# Patient Record
Sex: Male | Born: 1995 | Race: White | Hispanic: No | Marital: Single | Smoking: Current every day smoker
Health system: Southern US, Community
[De-identification: ages and names within clinical notes are randomized; demographics above are authoritative.]

## PROBLEM LIST (undated history)

## (undated) HISTORY — PX: WRIST SURGERY: SHX841

---
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
## 2014-08-22 IMAGING — CR DG FINGER LITTLE 2+V*L*
4 series · 4 of 4 positions shown · non-contrast
Comparison: None

CLINICAL DATA: Pain left little finger

EXAM:
LEFT LITTLE FINGER 2+V

[view not recorded (1 of 4)]
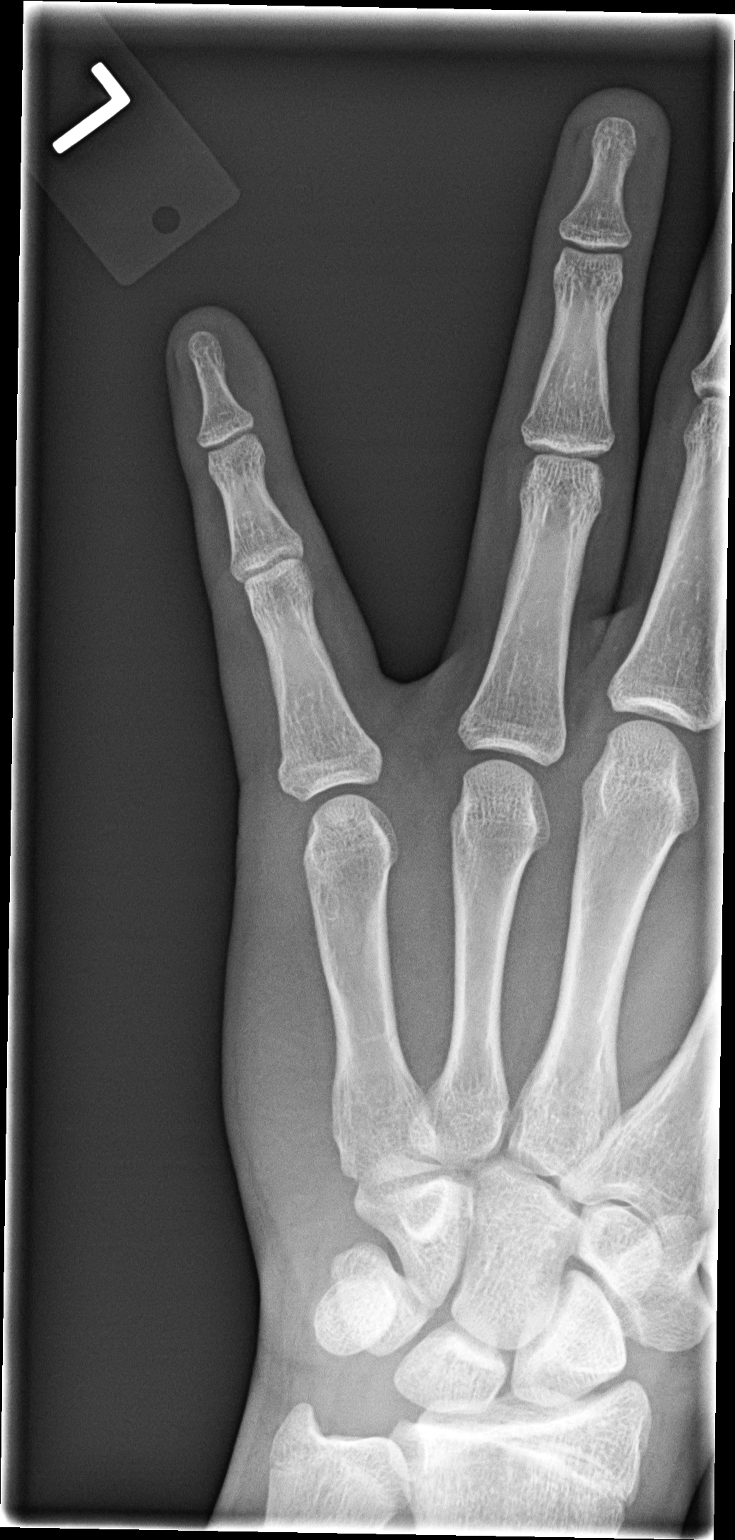

[view not recorded (2 of 4)]
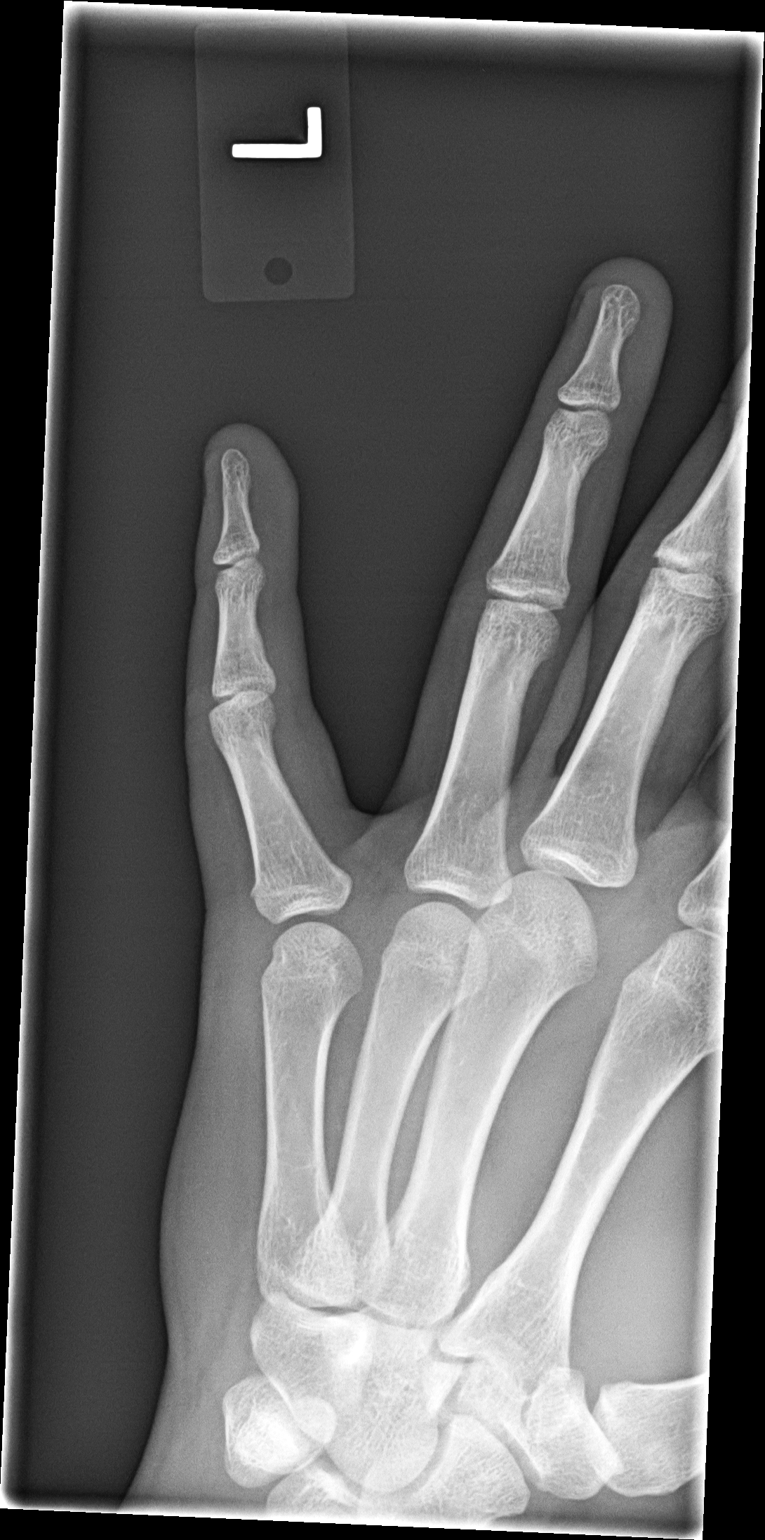

[view not recorded (3 of 4)]
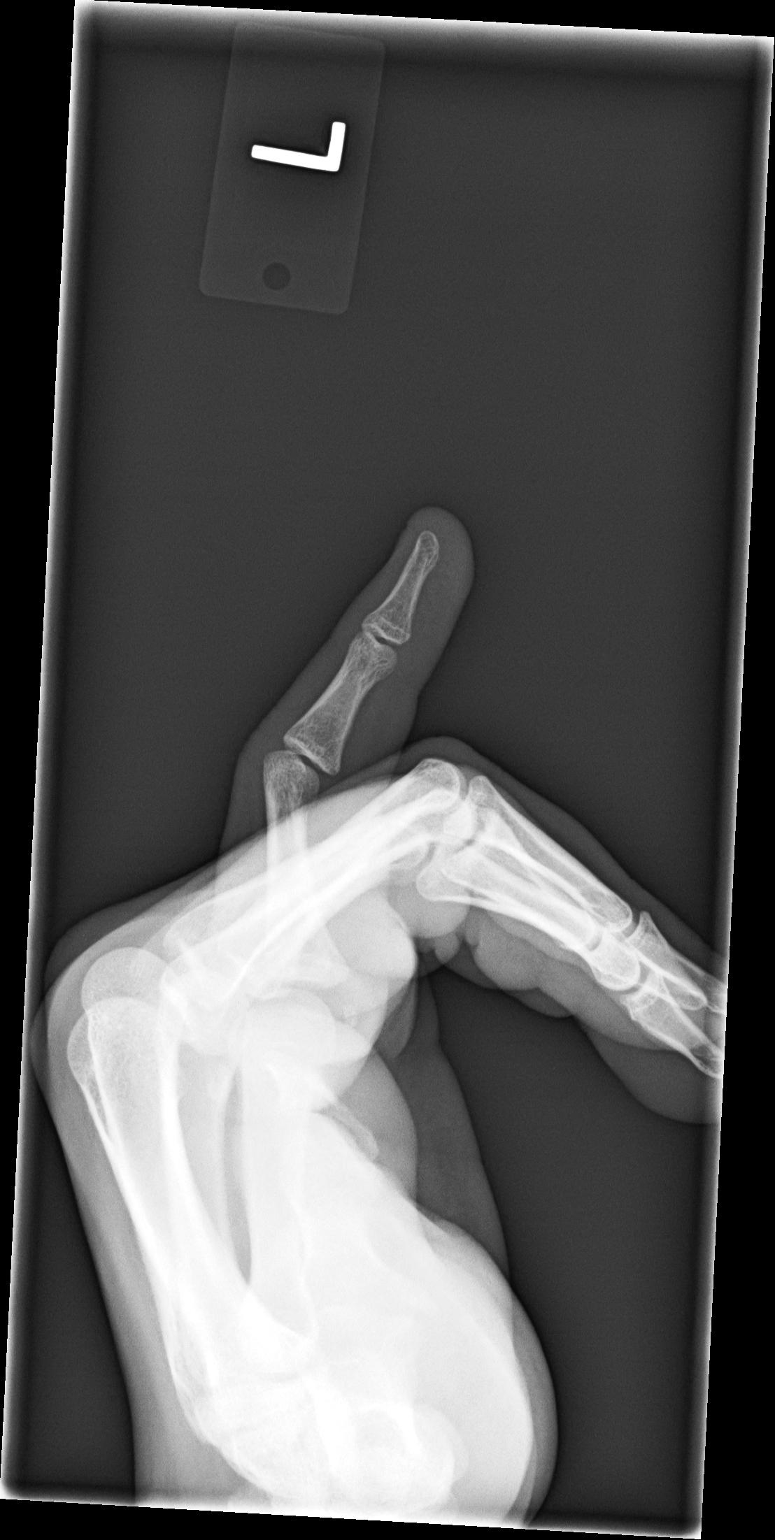

[view not recorded (4 of 4)]
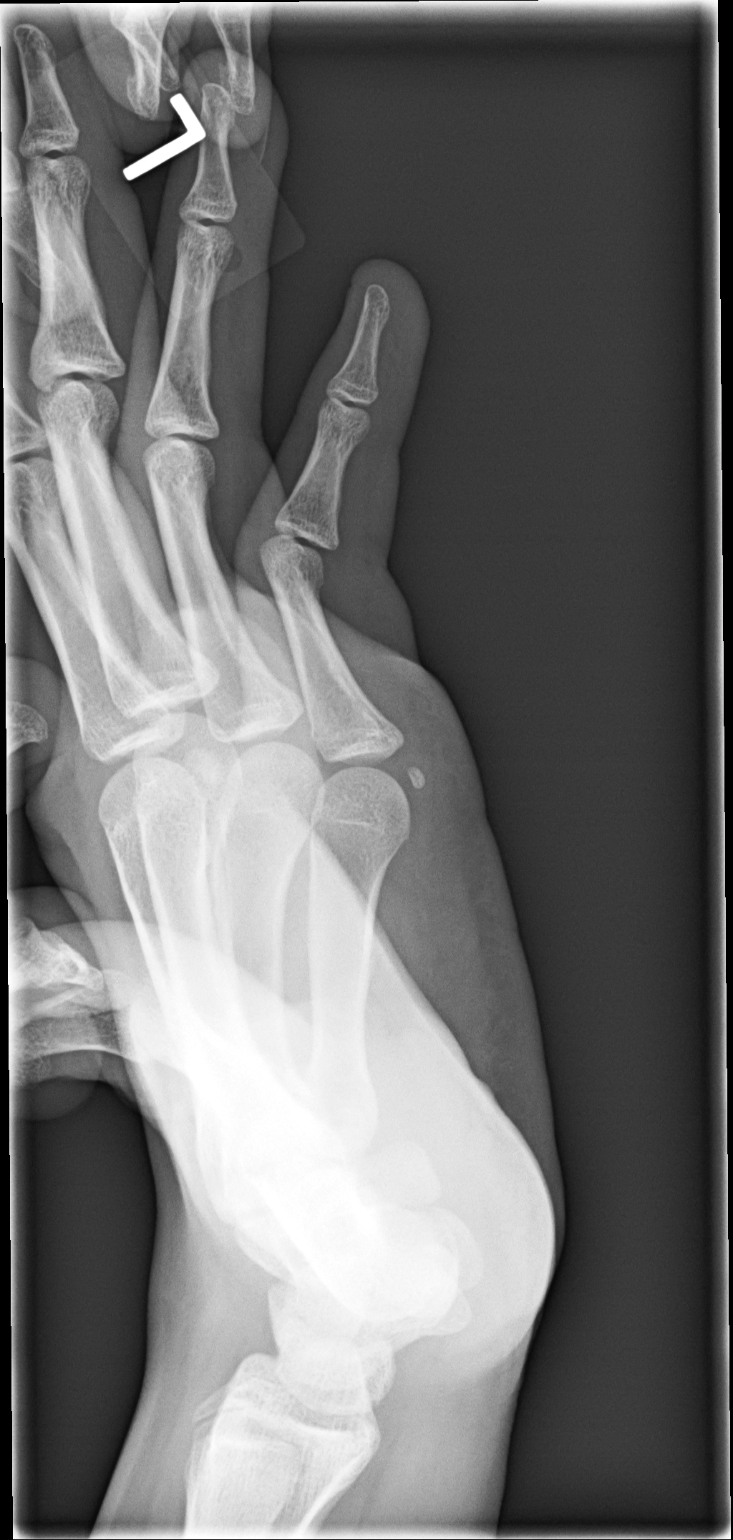

[4 of 4 positions shown; findings below may reference images not displayed]

FINDINGS: Osseous mineralization normal.

Joint spaces preserved.

Transverse fracture identified at base of proximal phalanx left
little finger, minimally displaced.

No additional fracture, dislocation or bone destruction.
IMPRESSION: Transverse fracture at base of proximal phalanx left little finger.

## 2014-11-29 ENCOUNTER — Encounter: Payer: Self-pay | Admitting: Family Medicine

## 2014-11-29 ENCOUNTER — Ambulatory Visit (INDEPENDENT_AMBULATORY_CARE_PROVIDER_SITE_OTHER): Payer: Commercial Managed Care - PPO | Admitting: Family Medicine

## 2014-11-29 VITALS — BP 132/70 | HR 65 | Temp 97.5°F | Ht 70.5 in | Wt 248.0 lb

## 2014-11-29 DIAGNOSIS — B360 Pityriasis versicolor: Secondary | ICD-10-CM

## 2014-11-29 DIAGNOSIS — M7580 Other shoulder lesions, unspecified shoulder: Secondary | ICD-10-CM

## 2014-11-29 DIAGNOSIS — M7581 Other shoulder lesions, right shoulder: Secondary | ICD-10-CM | POA: Diagnosis not present

## 2014-11-29 DIAGNOSIS — M778 Other enthesopathies, not elsewhere classified: Secondary | ICD-10-CM

## 2014-11-29 MED ORDER — DICLOFENAC SODIUM 75 MG PO TBEC: 75.0000 mg | DELAYED_RELEASE_TABLET | Freq: Two times a day (BID) | ORAL | Status: AC

## 2014-11-29 MED ORDER — FLUCONAZOLE 150 MG PO TABS: 150.0000 mg | ORAL_TABLET | Freq: Every day | ORAL | Status: DC

## 2014-11-29 NOTE — Progress Notes (Signed)
Subjective:  Patient ID: Lawrence Gallagher, male    DOB: [birth year]  Age: 19 y.o. MRN: [id number]  CC: patchy skin irritation and Shoulder Pain   HPI Lawrence ChurchHayden Gallagher presents for Right anterior shoulder pain. He patient states that the pain in the shoulders has been there for a few months but just got bad a few days ago. Specifically throwing football yesterday made it much worse. Additionally he states that lifting weights when he uses the bench press he has more pain in the right anterior shoulder. Also when he does his arm curl.  The rash he's noted for several months does tend to affect his ability is tan making it less consistent over his upper back and his left upper arm and shoulder. This has been present since last summer but he's noticed it again early this spring. Skin feels a little dry but otherwise is fine.  History Lawrence BasemanHayden has no past medical history on file.   He has past surgical history that includes Wrist surgery (Right, age 19).   His family history is not on file.He reports that he has never smoked. He does not have any smokeless tobacco history on file. He reports that he does not drink alcohol or use illicit drugs.  No current outpatient prescriptions on file prior to visit.   No current facility-administered medications on file prior to visit.    ROS Review of Systems  Constitutional: Negative for fever, chills, diaphoresis, activity change, appetite change and unexpected weight change.  HENT: Negative for congestion, ear discharge, ear pain, hearing loss, nosebleeds, postnasal drip, rhinorrhea, sinus pressure, sneezing, sore throat and trouble swallowing.   Respiratory: Negative for cough, chest tightness, shortness of breath and wheezing.   Cardiovascular: Negative for chest pain and palpitations.  Gastrointestinal: Negative for nausea, vomiting, abdominal pain, diarrhea, constipation and abdominal distention.  Endocrine: Negative for cold intolerance and heat  intolerance.  Genitourinary: Negative for dysuria, hematuria and flank pain.  Musculoskeletal: Positive for myalgias and arthralgias. Negative for joint swelling.  Skin: Positive for rash.  Neurological: Negative for dizziness and headaches.  Psychiatric/Behavioral: Negative for dysphoric mood, decreased concentration and agitation. The patient is not nervous/anxious.     Objective:  BP 132/70 mmHg  Pulse 65  Temp(Src) 97.5 F (36.4 C) (Oral)  Ht 5' 10.5" (1.791 m)  Wt 248 lb (112.492 kg)  BMI 35.07 kg/m2  BP Readings from Last 3 Encounters:  11/29/14 132/70  12/08/13 142/72  06/09/13 127/68    Wt Readings from Last 3 Encounters:  11/29/14 248 lb (112.492 kg) (99 %*, Z = 2.39)  12/08/13 215 lb 9.6 oz (97.796 kg) (97 %*, Z = 1.88)  06/09/13 220 lb (99.791 kg) (98 %*, Z = 2.03)   * Growth percentiles are based on CDC 2-20 Years data.     Physical Exam  Constitutional: He is oriented to person, place, and time. He appears well-developed and well-nourished. No distress.  HENT:  Head: Normocephalic and atraumatic.  Right Ear: External ear normal.  Left Ear: External ear normal.  Nose: Nose normal.  Mouth/Throat: Oropharynx is clear and moist.  Eyes: Conjunctivae and EOM are normal. Pupils are equal, round, and reactive to light.  Neck: Normal range of motion. Neck supple. No thyromegaly present.  Cardiovascular: Normal rate, regular rhythm and normal heart sounds.   No murmur heard. Pulmonary/Chest: Effort normal and breath sounds normal. No respiratory distress. He has no wheezes. He has no rales.  Abdominal: Soft. Bowel sounds are normal. He exhibits  no distension. There is no tenderness.  Lymphadenopathy:    He has no cervical adenopathy.  Neurological: He is alert and oriented to person, place, and time. He has normal reflexes.  Skin: Skin is warm and dry.  Psychiatric: He has a normal mood and affect. His behavior is normal. Judgment and thought content normal.     No results found for: HGBA1C  No results found for: WBC, HGB, HCT, PLT, GLUCOSE, CHOL, TRIG, HDL, LDLDIRECT, LDLCALC, ALT, AST, NA, K, CL, CREATININE, BUN, CO2, TSH, PSA, INR, GLUF, HGBA1C, MICROALBUR  No results found.  Assessment & Plan:   Lawrence Gallagher was seen today for patchy skin irritation and shoulder pain.  Diagnoses and all orders for this visit:  Tendonitis of shoulder, right  Tinea versicolor  Other orders -     fluconazole (DIFLUCAN) 150 MG tablet; Take 1 tablet (150 mg total) by mouth daily. -     diclofenac (VOLTAREN) 75 MG EC tablet; Take 1 tablet (75 mg total) by mouth 2 (two) times daily.   I have discontinued Mr. Stanwood naproxen. I am also having him start on fluconazole and diclofenac.  Meds ordered this encounter  Medications  . fluconazole (DIFLUCAN) 150 MG tablet    Sig: Take 1 tablet (150 mg total) by mouth daily.    Dispense:  5 tablet    Refill:  0  . diclofenac (VOLTAREN) 75 MG EC tablet    Sig: Take 1 tablet (75 mg total) by mouth 2 (two) times daily.    Dispense:  60 tablet    Refill:  2     Follow-up: No Follow-up on file.  Mechele Claude, M.D.

## 2015-10-26 ENCOUNTER — Telehealth: Payer: Self-pay | Admitting: Family Medicine

## 2015-10-26 MED ORDER — FLUCONAZOLE 150 MG PO TABS: 150.0000 mg | ORAL_TABLET | Freq: Every day | ORAL | Status: DC

## 2015-10-26 NOTE — Telephone Encounter (Signed)
Pt's mother is aware rx was sent to pharmacy.

## 2015-10-26 NOTE — Telephone Encounter (Signed)
done

## 2016-11-08 ENCOUNTER — Telehealth: Payer: Self-pay | Admitting: Family Medicine

## 2016-11-08 ENCOUNTER — Other Ambulatory Visit: Payer: Self-pay

## 2016-11-08 MED ORDER — FLUCONAZOLE 150 MG PO TABS: 150.0000 mg | ORAL_TABLET | Freq: Every day | ORAL | 1 refills | Status: AC

## 2016-11-08 NOTE — Telephone Encounter (Signed)
What is the name of the medication? It is a cream for a rash that he gets on his shoulders. Mom thinks it is the only rx he has gotten here.   Have you contacted your pharmacy to request a refill? no  Which pharmacy would you [facility] sent to?[facility] [address].   Patient notified that their request is being sent to the clinical staff for review and that they should receive a call once it is complete. If they do not receive a call within 24 hours they can check with their pharmacy or our office.

## 2016-11-08 NOTE — Telephone Encounter (Signed)
Spoke with mother and explained to her we had not prescribed a cream for Lawrence Gallagher, he had been prescribed Diflucan 150 mg.  She called pharmacy to check and called back to let me know this is correct.  Medication called in to [facility] ([facility] [address]).

## 2017-01-03 ENCOUNTER — Emergency Department (HOSPITAL_COMMUNITY)
Admission: EM | Admit: 2017-01-03 | Discharge: 2017-01-03 | Disposition: A | Discharge status: Home / Self Care | Payer: Self-pay | Attending: Emergency Medicine | Admitting: Emergency Medicine

## 2017-01-03 ENCOUNTER — Encounter (HOSPITAL_COMMUNITY): Payer: Self-pay | Admitting: Emergency Medicine

## 2017-01-03 ENCOUNTER — Emergency Department (HOSPITAL_COMMUNITY): Payer: Self-pay

## 2017-01-03 DIAGNOSIS — R1011 Right upper quadrant pain: Secondary | ICD-10-CM | POA: Insufficient documentation

## 2017-01-03 DIAGNOSIS — F17200 Nicotine dependence, unspecified, uncomplicated: Secondary | ICD-10-CM | POA: Insufficient documentation

## 2017-01-03 DIAGNOSIS — Z79899 Other long term (current) drug therapy: Secondary | ICD-10-CM | POA: Insufficient documentation

## 2017-01-03 LAB — CBC
HCT: 46.8 % (ref 39.0–52.0)
HEMOGLOBIN: 16.6 g/dL (ref 13.0–17.0)
MCH: 30.9 pg (ref 26.0–34.0)
MCHC: 35.5 g/dL (ref 30.0–36.0)
MCV: 87.2 fL (ref 78.0–100.0)
Platelets: 225 10*3/uL (ref 150–400)
RBC: 5.37 MIL/uL (ref 4.22–5.81)
RDW: 12.5 % (ref 11.5–15.5)
WBC: 10.9 10*3/uL — ABNORMAL HIGH (ref 4.0–10.5)

## 2017-01-03 LAB — COMPREHENSIVE METABOLIC PANEL
ALBUMIN: 4.7 g/dL (ref 3.5–5.0)
ALK PHOS: 68 U/L (ref 38–126)
ALT: 16 U/L — AB (ref 17–63)
ANION GAP: 8 (ref 5–15)
AST: 17 U/L (ref 15–41)
BUN: 8 mg/dL (ref 6–20)
CALCIUM: 9.6 mg/dL (ref 8.9–10.3)
CO2: 27 mmol/L (ref 22–32)
CREATININE: 1 mg/dL (ref 0.61–1.24)
Chloride: 104 mmol/L (ref 101–111)
GFR calc Af Amer: >60 mL/min (ref >60)
GFR calc non Af Amer: >60 mL/min (ref >60)
GLUCOSE: 92 mg/dL (ref 65–99)
Potassium: 3.7 mmol/L (ref 3.5–5.1)
SODIUM: 139 mmol/L (ref 135–145)
Total Bilirubin: 1.7 mg/dL — ABNORMAL HIGH (ref 0.3–1.2)
Total Protein: 7.5 g/dL (ref 6.5–8.1)

## 2017-01-03 LAB — URINALYSIS, ROUTINE W REFLEX MICROSCOPIC
BILIRUBIN URINE: NEGATIVE
Glucose, UA: NEGATIVE mg/dL
HGB URINE DIPSTICK: NEGATIVE
Ketones, ur: 5 mg/dL — AB
Leukocytes, UA: NEGATIVE
Nitrite: NEGATIVE
Protein, ur: NEGATIVE mg/dL
SPECIFIC GRAVITY, URINE: 1.015 (ref 1.005–1.030)
pH: 7 (ref 5.0–8.0)

## 2017-01-03 LAB — LIPASE, BLOOD: Lipase: 18 U/L (ref 11–51)

## 2017-01-03 MED ORDER — ONDANSETRON 8 MG PO TBDP: 8.0000 mg | ORAL_TABLET | Freq: Three times a day (TID) | ORAL | 0 refills | Status: AC | PRN

## 2017-01-03 MED ORDER — FAMOTIDINE 20 MG PO TABS: 20.0000 mg | ORAL_TABLET | Freq: Two times a day (BID) | ORAL | 0 refills | Status: AC

## 2017-01-03 MED ORDER — ONDANSETRON HCL 4 MG/2ML IJ SOLN
4.0000 mg | Freq: Once | INTRAMUSCULAR | Status: AC
  Administered 2017-01-03: 4 mg via INTRAVENOUS
  Filled 2017-01-03: qty 2

## 2017-01-03 MED ORDER — SODIUM CHLORIDE 0.9 % IV BOLUS (SEPSIS)
1000.0000 mL | Freq: Once | INTRAVENOUS | Status: AC
  Administered 2017-01-03: 1000 mL via INTRAVENOUS

## 2017-01-03 MED ORDER — NAPROXEN 500 MG PO TABS: 500.0000 mg | ORAL_TABLET | Freq: Two times a day (BID) | ORAL | 0 refills | Status: AC

## 2017-01-03 NOTE — ED Notes (Signed)
Patient c/o abd. Pain with vomiting onset 1 week ago states he had an u/s done 6-7 months ago thought he had a kidney stone however was told it was his gall bladder. States he has also been constipated.

## 2017-01-03 NOTE — ED Provider Notes (Signed)
[facility] Provider Note   CSN: [id number] Arrival date & time: 01/03/17  1359  By signing my name below, I, Lawrence Gallagher, attest that this documentation has been prepared under the direction and in the presence of Linwood Dibbles, MD . Electronically Signed: Freida Gallagher, Scribe. 01/03/2017. 2:57 PM.  History   Chief Complaint Chief Complaint  Patient presents with  . Abdominal Pain    The history is provided by the patient. No language interpreter was used.    HPI Comments:  Lawrence Gallagher is a 21 y.o. male who presents to the Emergency Department complaining of moderate right sided abdominal pain and right sided back pain x ~ 1.5 weeks. He reports associated nausea and vomiting. Pt states he was evaluated for similar episode 6-7 months ago and had an [place] that showed inflammation of the gallbladder. Pt states he was advised to have follow up imaging which he did not do. No alleviating factors noted.   History reviewed. No pertinent past medical history.  Patient Active Problem List   Diagnosis Date Noted  . Tendonitis of shoulder 11/29/2014  . ALLERGIC RHINITIS 01/21/2008    Past Surgical History:  Procedure Laterality Date  . WRIST SURGERY Right age 69       Home Medications    Prior to Admission medications   Medication Sig Start Date End Date Taking? Authorizing Provider  diclofenac (VOLTAREN) 75 MG EC tablet Take 1 tablet (75 mg total) by mouth 2 (two) times daily. 11/29/14   Mechele Claude, MD  famotidine (PEPCID) 20 MG tablet Take 1 tablet (20 mg total) by mouth 2 (two) times daily. 01/03/17   Linwood Dibbles, MD  fluconazole (DIFLUCAN) 150 MG tablet Take 1 tablet (150 mg total) by mouth daily. 11/08/16   Mechele Claude, MD  naproxen (NAPROSYN) 500 MG tablet Take 1 tablet (500 mg total) by mouth 2 (two) times daily with a meal. As needed for pain 01/03/17   Linwood Dibbles, MD  ondansetron (ZOFRAN ODT) 8 MG disintegrating tablet Take 1 tablet (8 mg total) by mouth every 8 (eight)  hours as needed for nausea or vomiting. 01/03/17   Linwood Dibbles, MD    Family History History reviewed. No pertinent family history.  Social History Social History  Substance Use Topics  . Smoking status: Current Every Day Smoker  . Smokeless tobacco: Never Used  . Alcohol use Yes     Comment: occ     Allergies   Patient has no known allergies.   Review of Systems Review of Systems  Constitutional: Negative for chills and fever.  Respiratory: Negative for shortness of breath.   Cardiovascular: Negative for chest pain.  Gastrointestinal: Positive for abdominal pain.  Musculoskeletal: Positive for back pain.  All other systems reviewed and are negative.  Physical Exam Updated Vital Signs BP 139/86 (BP Location: Right Arm)   Pulse 68   Temp 98.5 F (36.9 C) (Oral)   Resp 18   SpO2 98%   Physical Exam  Constitutional: He appears well-developed and well-nourished. No distress.  HENT:  Head: Normocephalic and atraumatic.  Right Ear: External ear normal.  Left Ear: External ear normal.  Eyes: Conjunctivae are normal. Right eye exhibits no discharge. Left eye exhibits no discharge. No scleral icterus.  Neck: Neck supple. No tracheal deviation present.  Cardiovascular: Normal rate, regular rhythm and intact distal pulses.   Pulmonary/Chest: Effort normal and breath sounds normal. No stridor. No respiratory distress. He has no wheezes. He has no rales.  Abdominal:  Soft. Bowel sounds are normal. He exhibits no distension. There is tenderness in the right upper quadrant. There is no rebound and no guarding.  Musculoskeletal: He exhibits no edema or tenderness.  Neurological: He is alert. He has normal strength. No cranial nerve deficit (no facial droop, extraocular movements intact, no slurred speech) or sensory deficit. He exhibits normal muscle tone. He displays no seizure activity. Coordination normal.  Skin: Skin is warm and dry. No rash noted.  Psychiatric: He has a normal  mood and affect.  Nursing note and vitals reviewed.  ED Treatments / Results  DIAGNOSTIC STUDIES:  Oxygen Saturation is 98% on RA, normal by my interpretation.    COORDINATION OF CARE:  2:57 PM Discussed treatment plan with pt at bedside and pt agreed to plan.  Labs (all labs ordered are listed, but only abnormal results are displayed) Labs Reviewed  COMPREHENSIVE METABOLIC PANEL - Abnormal; Notable for the following:       Result Value   ALT 16 (*)    Total Bilirubin 1.7 (*)    All other components within normal limits  CBC - Abnormal; Notable for the following:    WBC 10.9 (*)    All other components within normal limits  URINALYSIS, ROUTINE W REFLEX MICROSCOPIC - Abnormal; Notable for the following:    APPearance CLOUDY (*)    Ketones, ur 5 (*)    All other components within normal limits  LIPASE, BLOOD    EKG  EKG Interpretation None       Radiology US Abdomen Complete  Result Date: 01/03/2017 CLINICAL DATA:  Right upper quadrant pain for 1 week. EXAM: ABDOMEN ULTRASOUND COMPLETE COMPARISON:  None. FINDINGS: Gallbladder: No gallstones or wall thickening visualized. No sonographic Murphy sign noted by sonographer. Common bile duct: Diameter: 3 mm, within normal limits. Liver: No focal lesion identified. Within normal limits in parenchymal echogenicity. IVC: No abnormality visualized. Pancreas: Visualized portion unremarkable. Spleen: Size and appearance within normal limits. Right Kidney: Length: 12.8 cm. Echogenicity within normal limits. No mass or hydronephrosis visualized. Left Kidney: Length: 12.4 cm. Echogenicity within normal limits. No mass or hydronephrosis visualized. Abdominal aorta: No aneurysm visualized. Other findings: None. IMPRESSION: Negative abdomen ultrasound. Electronically Signed   By: Myles Rosenthal M.D.   On: 01/03/2017 16:18    Procedures Procedures (including critical care time)  Medications Ordered in ED Medications  sodium chloride 0.9 %  bolus 1,000 mL (1,000 mLs Intravenous New Bag/Given 01/03/17 1525)  ondansetron (ZOFRAN) injection 4 mg (4 mg Intravenous Given 01/03/17 1525)     Initial Impression / Assessment and Plan / ED Course  I have reviewed the triage vital signs and the nursing notes.  Pertinent labs & imaging results that were available during my care of the patient were reviewed by me and considered in my medical decision making (see chart for details).   Labs are reassuring.  No sign of hepatitis or pancreatitis.  No uti.  [place] without acute findings.  At this time there does not appear to be any evidence of an acute emergency medical condition and the patient appears stable for discharge with appropriate outpatient follow up.    Final Clinical Impressions(s) / ED Diagnoses   Final diagnoses:  Right upper quadrant abdominal pain    New Prescriptions New Prescriptions   FAMOTIDINE (PEPCID) 20 MG TABLET    Take 1 tablet (20 mg total) by mouth 2 (two) times daily.   NAPROXEN (NAPROSYN) 500 MG TABLET    Take 1  tablet (500 mg total) by mouth 2 (two) times daily with a meal. As needed for pain   ONDANSETRON (ZOFRAN ODT) 8 MG DISINTEGRATING TABLET    Take 1 tablet (8 mg total) by mouth every 8 (eight) hours as needed for nausea or vomiting.   I personally performed the services described in this documentation, which was scribed in my presence.  The recorded information has been reviewed and is accurate.    Linwood Dibbles, MD 01/03/17 [phone]

## 2017-01-03 NOTE — Discharge Instructions (Signed)
The ultrasound today did not show any troubles with your gallbladder.  Take the medications as needed for pain, follow up with a primary care doctor if the symptoms do not resolve over the next week

## 2017-01-03 NOTE — ED Triage Notes (Signed)
Pt sts upper abd and back pain and N/V intermittently x 1 week

## 2017-12-19 ENCOUNTER — Telehealth: Payer: Self-pay | Admitting: Family Medicine

## 2017-12-19 NOTE — Telephone Encounter (Signed)
Patient aware that you are out of the office until tomorrow.

## 2017-12-20 NOTE — Telephone Encounter (Signed)
He will need to go to a physician near his school.I cannot prescribe for him for this without seeing him.

## 2017-12-20 NOTE — Telephone Encounter (Signed)
lmtcb

## 2017-12-31 NOTE — Telephone Encounter (Signed)
Attempted to call again to inform her patient needs to be seen.  First message 11 days ago.  Will file, will inform if patient calls again.
